# Patient Record
Sex: Male | Born: 1970 | Race: Black or African American | Hispanic: No | Marital: Single | Smoking: Current some day smoker
Health system: Southern US, Community
[De-identification: ages and names within clinical notes are randomized; demographics above are authoritative.]

## PROBLEM LIST (undated history)

## (undated) DIAGNOSIS — J45909 Unspecified asthma, uncomplicated: Secondary | ICD-10-CM

---

## 2013-10-20 ENCOUNTER — Emergency Department (HOSPITAL_COMMUNITY)
Admission: EM | Admit: 2013-10-20 | Discharge: 2013-10-20 | Disposition: A | Discharge status: Home / Self Care | Payer: Medicaid - Out of State | Attending: Emergency Medicine | Admitting: Emergency Medicine

## 2013-10-20 ENCOUNTER — Encounter (HOSPITAL_COMMUNITY): Payer: Self-pay | Admitting: Emergency Medicine

## 2013-10-20 ENCOUNTER — Emergency Department (HOSPITAL_COMMUNITY): Payer: Medicaid - Out of State

## 2013-10-20 DIAGNOSIS — IMO0002 Reserved for concepts with insufficient information to code with codable children: Secondary | ICD-10-CM | POA: Insufficient documentation

## 2013-10-20 DIAGNOSIS — S46912A Strain of unspecified muscle, fascia and tendon at shoulder and upper arm level, left arm, initial encounter: Secondary | ICD-10-CM

## 2013-10-20 DIAGNOSIS — Z79899 Other long term (current) drug therapy: Secondary | ICD-10-CM | POA: Insufficient documentation

## 2013-10-20 DIAGNOSIS — Y939 Activity, unspecified: Secondary | ICD-10-CM | POA: Insufficient documentation

## 2013-10-20 DIAGNOSIS — F17200 Nicotine dependence, unspecified, uncomplicated: Secondary | ICD-10-CM | POA: Insufficient documentation

## 2013-10-20 DIAGNOSIS — Y929 Unspecified place or not applicable: Secondary | ICD-10-CM | POA: Insufficient documentation

## 2013-10-20 DIAGNOSIS — X58XXXA Exposure to other specified factors, initial encounter: Secondary | ICD-10-CM | POA: Insufficient documentation

## 2013-10-20 DIAGNOSIS — J45909 Unspecified asthma, uncomplicated: Secondary | ICD-10-CM | POA: Insufficient documentation

## 2013-10-20 HISTORY — DX: Unspecified asthma, uncomplicated: J45.909

## 2013-10-20 NOTE — ED Notes (Signed)
Patient transported to X-ray 

## 2013-10-20 NOTE — ED Provider Notes (Signed)
Medical screening examination/treatment/procedure(s) were performed by non-physician practitioner and as supervising physician I was immediately available for consultation/collaboration.  EKG Interpretation    Date/Time:  Saturday October 20 2013 16:37:33 EST Ventricular Rate:  82 PR Interval:  120 QRS Duration: 82 QT Interval:  360 QTC Calculation: 420 R Axis:   61 Text Interpretation:  Normal sinus rhythm with sinus arrhythmia Normal ECG No old tracing to compare Confirmed by Blanca Carreon  MD, Genea Rheaume ([id number]) on 10/20/2013 4:59:09 PM              Lyanne CoKevin M Kissie Ziolkowski, MD 10/20/13 2302

## 2013-10-20 NOTE — Discharge Instructions (Signed)
Muscle Strain A muscle strain is an injury that occurs when a muscle is stretched beyond its normal length. Usually a small number of muscle fibers are torn when this happens. Muscle strain is rated in degrees. First-degree strains have the least amount of muscle fiber tearing and pain. Second-degree and third-degree strains have increasingly more tearing and pain.  Usually, recovery from muscle strain takes 1 2 weeks. Complete healing takes 5 6 weeks.  CAUSES  Muscle strain happens when a sudden, violent force placed on a muscle stretches it too far. This may occur with lifting, sports, or a fall.  RISK FACTORS Muscle strain is especially common in athletes.  SIGNS AND SYMPTOMS At the site of the muscle strain, there may be:  Pain.  Bruising.  Swelling.  Difficulty using the muscle due to pain or lack of normal function. DIAGNOSIS  Your health care provider will perform a physical exam and ask about your medical history. TREATMENT  Often, the best treatment for a muscle strain is resting, icing, and applying cold compresses to the injured area.  HOME CARE INSTRUCTIONS   Use the PRICE method of treatment to promote muscle healing during the first 2 3 days after your injury. The PRICE method involves:  Protecting the muscle from being injured again.  Restricting your activity and resting the injured body part.  Icing your injury. To do this, put ice in a plastic bag. Place a towel between your skin and the bag. Then, apply the ice and leave it on from 15 20 minutes each hour. After the third day, switch to moist heat packs.  Apply compression to the injured area with a splint or elastic bandage. Be careful not to wrap it too tightly. This may interfere with blood circulation or increase swelling.  Elevate the injured body part above the level of your heart as often as you can.  Only take over-the-counter or prescription medicines for pain, discomfort, or fever as directed by your  health care provider.  Warming up prior to exercise helps to prevent future muscle strains. SEEK MEDICAL CARE IF:   You have increasing pain or swelling in the injured area.  You have numbness, tingling, or a significant loss of strength in the injured area. MAKE SURE YOU:   Understand these instructions.  Will watch your condition.  Will get help right away if you are not doing well or get worse. Document Released: 09/06/2005 Document Revised: 06/27/2013 Document Reviewed: 04/05/2013 [facility] Patient Information 2014 [place], [place].  Shoulder Sprain A shoulder sprain is the result of damage to the tough, fiber-like tissues (ligaments) that help hold your shoulder in place. The ligaments may be stretched or torn. Besides the main shoulder joint (the ball and socket), there are several smaller joints that connect the bones in this area. A sprain usually involves one of those joints. Most often it is the acromioclavicular (or AC) joint. That is the joint that connects the collarbone (clavicle) and the shoulder blade (scapula) at the top point of the shoulder blade (acromion). A shoulder sprain is a mild form of what is called a shoulder separation. Recovering from a shoulder sprain may take some time. For some, pain lingers for several months. Most people recover without long term problems. CAUSES   A shoulder sprain is usually caused by some kind of trauma. This might be:  Falling on an outstretched arm.  Being hit hard on the shoulder.  Twisting the arm.  Shoulder sprains are more likely to occur  in people who:  Play sports.  Have balance or coordination problems. SYMPTOMS   Pain when you move your shoulder.  Limited ability to move the shoulder.  Swelling and tenderness on top of the shoulder.  Redness or warmth in the shoulder.  Bruising.  A change in the shape of the shoulder. DIAGNOSIS  Your healthcare provider may:  Ask about your symptoms.  Ask about  recent activity that might have caused those symptoms.  Examine your shoulder. You may be asked to do simple exercises to test movement. The other shoulder will be examined for comparison.  Order some tests that provide a look inside the body. They can show the extent of the injury. The tests could include:  X-rays.  CT (computed tomography) scan.  MRI (magnetic resonance imaging) scan. RISKS AND COMPLICATIONS  Loss of full shoulder motion.  Ongoing shoulder pain. TREATMENT  How long it takes to recover from a shoulder sprain depends on how severe it was. Treatment options may include:  Rest. You should not use the arm or shoulder until it heals.  Ice. For 2 or 3 days after the injury, put an ice pack on the shoulder up to 4 times a day. It should stay on for 15 to 20 minutes each time. Wrap the ice in a towel so it does not touch your skin.  Over-the-counter medicine to relieve pain.  A sling or brace. This will keep the arm still while the shoulder is healing.  Physical therapy or rehabilitation exercises. These will help you regain strength and motion. Ask your healthcare provider when it is OK to begin these exercises.  Surgery. The need for surgery is rare with a sprained shoulder, but some people may need surgery to keep the joint in place and reduce pain. HOME CARE INSTRUCTIONS   Ask your healthcare provider about what you should and should not do while your shoulder heals.  Make sure you know how to apply ice to the correct area of your shoulder.  Talk with your healthcare provider about which medications should be used for pain and swelling.  If rehabilitation therapy will be needed, ask your healthcare provider to refer you to a therapist. If it is not recommended, then ask about at-home exercises. Find out when exercise should begin. SEEK MEDICAL CARE IF:  Your pain, swelling, or redness at the joint increases. SEEK IMMEDIATE MEDICAL CARE IF:   You have a  fever.  You cannot move your arm or shoulder. Document Released: 01/23/2009 Document Revised: 11/29/2011 Document Reviewed: 01/23/2009 [facility] Patient Information 2014 [place], [place].

## 2013-10-20 NOTE — ED Provider Notes (Signed)
CSN: [id number]     Arrival date & time 10/20/13  1544 History   First MD Initiated Contact with Patient 10/20/13 1611     This chart was scribed for Ian CoKevin M Campos, MD by Arlan OrganAshley Leger, ED Scribe. This patient was seen in room TR05C/TR05C and the patient's care was started 4:18 PM.   Chief Complaint  Patient presents with  . Shoulder Pain    Left shoulder   The history is provided by the patient. No language interpreter was used.    HPI Comments: Ian Ewing is a 43 y.o. dominant right handed male who presents to the Emergency Department complaining of sudden onset, ongoing, constant, moderate left sided shoulder pain that radiates to his back and to the left side of his chest that initially started about 2 hours prior to arrival. Pt states he was cleaning out his car when he noted the onset of his pain. Denies any trauma or heavy lifting in the last several days. He states lifting his left arm worsens his discomfort, and denies any alleviating factors at this time. Pt denies any history of injury to his left shoulder.  He has not tried anything OTC for his shoulder pain. Denies any numbness or tingling to his left arm. Pt has a PMHx of asthma, and denies any other complaints this visit.  Past Medical History  Diagnosis Date  . Asthma    History reviewed. No pertinent past surgical history. No family history on file. History  Substance Use Topics  . Smoking status: Current Some Day Smoker  . Smokeless tobacco: Not on file  . Alcohol Use: No    Review of Systems  Musculoskeletal: Positive for arthralgias (Left sided shoulder pain).  Neurological: Negative for numbness.  All other systems reviewed and are negative.    Allergies  Review of patient's allergies indicates no known allergies.  Home Medications   Current Outpatient Rx  Name  Route  Sig  Dispense  Refill  . albuterol (PROVENTIL HFA;VENTOLIN HFA) 108 (90 BASE) MCG/ACT inhaler   Inhalation   Inhale 2 puffs into the  lungs every 6 (six) hours as needed for wheezing or shortness of breath.           Triage Vitals: BP 140/85  Pulse 98  Temp(Src) 97 F (36.1 C) (Oral)  Resp 18  SpO2 96%  Physical Exam  Nursing note and vitals reviewed. Constitutional: He is oriented to person, place, and time. He appears well-developed and well-nourished.  HENT:  Head: Normocephalic and atraumatic.  Eyes: EOM are normal.  Neck: Normal range of motion.  Cardiovascular: Normal rate.   Pulmonary/Chest: Effort normal.  Musculoskeletal: He exhibits tenderness.       Left shoulder: He exhibits decreased range of motion, tenderness and pain. He exhibits no bony tenderness, no swelling, no effusion, no crepitus, no deformity, normal pulse and normal strength.  Negative impingement signs, ROM limited due to pain but strength intact - sensation normal.  Neurological: He is alert and oriented to person, place, and time. He has normal strength. He displays no atrophy. No cranial nerve deficit or sensory deficit. He exhibits normal muscle tone. Coordination and gait normal. GCS eye subscore is 4. GCS verbal subscore is 5. GCS motor subscore is 6.  5/5 strength biceps, triceps, trapezius  Skin: Skin is warm and dry.  Psychiatric: He has a normal mood and affect. His behavior is normal.    ED Course  Procedures (including critical care time)  DIAGNOSTIC STUDIES: Oxygen  Saturation is 96% on RA, adequate by my interpretation.    COORDINATION OF CARE: 4:23 PM- Will order an X-Ray and EKG. Discussed treatment plan with pt at bedside and pt agreed to plan.     Labs Review Labs Reviewed - No data to display Imaging Review Dg Shoulder Left  10/20/2013   CLINICAL DATA:  Left shoulder pain  EXAM: LEFT SHOULDER - 2+ VIEW  COMPARISON:  None.  FINDINGS: There is no evidence of fracture or dislocation. There is no evidence of arthropathy or other focal bone abnormality. Soft tissues are unremarkable.  IMPRESSION: Negative.    Electronically Signed   By: Elige Ko   On: 10/20/2013 16:55    EKG Interpretation    Date/Time:  Saturday October 20 2013 16:37:33 EST Ventricular Rate:  82 PR Interval:  120 QRS Duration: 82 QT Interval:  360 QTC Calculation: 420 R Axis:   61 Text Interpretation:  Normal sinus rhythm with sinus arrhythmia Normal ECG No old tracing to compare Confirmed by CAMPOS  MD, KEVIN ([id number]) on 10/20/2013 4:59:09 PM          No results found for this or any previous visit. Dg Shoulder Left  10/20/2013   CLINICAL DATA:  Left shoulder pain  EXAM: LEFT SHOULDER - 2+ VIEW  COMPARISON:  None.  FINDINGS: There is no evidence of fracture or dislocation. There is no evidence of arthropathy or other focal bone abnormality. Soft tissues are unremarkable.  IMPRESSION: Negative.   Electronically Signed   By: Elige Ko   On: 10/20/2013 16:55      MDM  Left shoulder strain  Patient here with left shoulder pain that began while cleaning his car.  There is no evidence of acute injury, ROM limited due to pain, mild pain at insertion site of supraspinatous, no neck pain or clinical evidence of cord compression or cauda equina.  Patient placed in sling, he refuses medication at this time and will try over the counter muscle rubs and massage.  I personally performed the services described in this documentation, which was scribed in my presence. The recorded information has been reviewed and is accurate.    Izola Price Marisue Humble, PA-C 10/20/13 1713

## 2013-10-20 NOTE — ED Notes (Signed)
Pt c/o left shoulder pain onset 2 hours PTA. Pt reports that he was cleaning out his car prior to onset of pain.

## 2014-01-02 ENCOUNTER — Encounter (HOSPITAL_COMMUNITY): Payer: Self-pay | Admitting: Emergency Medicine

## 2014-01-02 ENCOUNTER — Emergency Department (HOSPITAL_COMMUNITY)
Admission: EM | Admit: 2014-01-02 | Discharge: 2014-01-02 | Disposition: A | Discharge status: Home / Self Care | Payer: Medicaid - Out of State | Attending: Emergency Medicine | Admitting: Emergency Medicine

## 2014-01-02 ENCOUNTER — Emergency Department (HOSPITAL_COMMUNITY): Payer: Medicaid - Out of State

## 2014-01-02 DIAGNOSIS — F411 Generalized anxiety disorder: Secondary | ICD-10-CM | POA: Insufficient documentation

## 2014-01-02 DIAGNOSIS — R51 Headache: Secondary | ICD-10-CM | POA: Insufficient documentation

## 2014-01-02 DIAGNOSIS — R079 Chest pain, unspecified: Secondary | ICD-10-CM

## 2014-01-02 DIAGNOSIS — Z79899 Other long term (current) drug therapy: Secondary | ICD-10-CM | POA: Insufficient documentation

## 2014-01-02 DIAGNOSIS — J45901 Unspecified asthma with (acute) exacerbation: Secondary | ICD-10-CM | POA: Insufficient documentation

## 2014-01-02 DIAGNOSIS — F17200 Nicotine dependence, unspecified, uncomplicated: Secondary | ICD-10-CM | POA: Insufficient documentation

## 2014-01-02 LAB — I-STAT TROPONIN, ED
TROPONIN I, POC: 0.01 ng/mL (ref 0.00–0.08)
Troponin i, poc: 0 ng/mL (ref 0.00–0.08)

## 2014-01-02 LAB — COMPREHENSIVE METABOLIC PANEL
ALT: 46 U/L (ref 0–53)
AST: 49 U/L — ABNORMAL HIGH (ref 0–37)
Albumin: 4.1 g/dL (ref 3.5–5.2)
Alkaline Phosphatase: 52 U/L (ref 39–117)
BUN: 12 mg/dL (ref 6–23)
CALCIUM: 9.5 mg/dL (ref 8.4–10.5)
CO2: 26 mEq/L (ref 19–32)
Chloride: 98 mEq/L (ref 96–112)
Creatinine, Ser: 1.45 mg/dL — ABNORMAL HIGH (ref 0.50–1.35)
GFR calc non Af Amer: 59 mL/min — ABNORMAL LOW (ref >90)
GFR, EST AFRICAN AMERICAN: 68 mL/min — AB (ref >90)
GLUCOSE: 85 mg/dL (ref 70–99)
Potassium: 4.6 mEq/L (ref 3.7–5.3)
Sodium: 139 mEq/L (ref 137–147)
TOTAL PROTEIN: 7.5 g/dL (ref 6.0–8.3)
Total Bilirubin: <0.2 mg/dL — ABNORMAL LOW (ref 0.3–1.2)

## 2014-01-02 LAB — CBC
HEMATOCRIT: 42.2 % (ref 39.0–52.0)
Hemoglobin: 14.7 g/dL (ref 13.0–17.0)
MCH: 30.4 pg (ref 26.0–34.0)
MCHC: 34.8 g/dL (ref 30.0–36.0)
MCV: 87.4 fL (ref 78.0–100.0)
PLATELETS: 213 10*3/uL (ref 150–400)
RBC: 4.83 MIL/uL (ref 4.22–5.81)
RDW: 12.8 % (ref 11.5–15.5)
WBC: 7.3 10*3/uL (ref 4.0–10.5)

## 2014-01-02 NOTE — ED Notes (Signed)
PA at bedside.

## 2014-01-02 NOTE — Discharge Instructions (Signed)
Chest Pain (Nonspecific) °It is often hard to give a specific diagnosis for the cause of chest pain. There is always a chance that your pain could be related to something serious, such as a heart attack or a blood clot in the lungs. You need to follow up with your caregiver for further evaluation. °CAUSES  °· Heartburn. °· Pneumonia or bronchitis. °· Anxiety or stress. °· Inflammation around your heart (pericarditis) or lung (pleuritis or pleurisy). °· A blood clot in the lung. °· A collapsed lung (pneumothorax). It can develop suddenly on its own (spontaneous pneumothorax) or from injury (trauma) to the chest. °· Shingles infection (herpes zoster virus). °The chest wall is composed of bones, muscles, and cartilage. Any of these can be the source of the pain. °· The bones can be bruised by injury. °· The muscles or cartilage can be strained by coughing or overwork. °· The cartilage can be affected by inflammation and become sore (costochondritis). °DIAGNOSIS  °Lab tests or other studies, such as X-rays, electrocardiography, stress testing, or cardiac imaging, may be needed to find the cause of your pain.  °TREATMENT  °· Treatment depends on what may be causing your chest pain. Treatment may include: °· Acid blockers for heartburn. °· Anti-inflammatory medicine. °· Pain medicine for inflammatory conditions. °· Antibiotics if an infection is present. °· You may be advised to change lifestyle habits. This includes stopping smoking and avoiding alcohol, caffeine, and chocolate. °· You may be advised to keep your head raised (elevated) when sleeping. This reduces the chance of acid going backward from your stomach into your esophagus. °· Most of the time, nonspecific chest pain will improve within 2 to 3 days with rest and mild pain medicine. °HOME CARE INSTRUCTIONS  °· If antibiotics were prescribed, take your antibiotics as directed. Finish them even if you start to feel better. °· For the next few days, avoid physical  activities that bring on chest pain. Continue physical activities as directed. °· Do not smoke. °· Avoid drinking alcohol. °· Only take over-the-counter or prescription medicine for pain, discomfort, or fever as directed by your caregiver. °· Follow your caregiver's suggestions for further testing if your chest pain does not go away. °· Keep any follow-up appointments you made. If you do not go to an appointment, you could develop lasting (chronic) problems with pain. If there is any problem keeping an appointment, you must call to reschedule. °SEEK MEDICAL CARE IF:  °· You think you are having problems from the medicine you are taking. Read your medicine instructions carefully. °· Your chest pain does not go away, even after treatment. °· You develop a rash with blisters on your chest. °SEEK IMMEDIATE MEDICAL CARE IF:  °· You have increased chest pain or pain that spreads to your arm, neck, jaw, back, or abdomen. °· You develop shortness of breath, an increasing cough, or you are coughing up blood. °· You have severe back or abdominal pain, feel nauseous, or vomit. °· You develop severe weakness, fainting, or chills. °· You have a fever. °THIS IS AN EMERGENCY. Do not wait to see if the pain will go away. Get medical help at once. Call your local emergency services (911 in [place] Do not drive yourself to the hospital. °MAKE SURE YOU:  °· Understand these instructions. °· Will watch your condition. °· Will get help right away if you are not doing well or get worse. °Document Released: 06/16/2005 Document Revised: 11/29/2011 Document Reviewed: 04/11/2008 °Exit[employer]® Patient Information ©2014 [employer],   LLC. ° °

## 2014-01-02 NOTE — ED Notes (Signed)
Pt reports onset of left sided chest pain since this am. Pain 7/10. Reports pain as tightness. Reports shortness of breath, dizziness.

## 2014-01-02 NOTE — ED Provider Notes (Signed)
CSN: [id number]     Arrival date & time 01/02/14  1817 History   First MD Initiated Contact with Patient 01/02/14 1859     Chief Complaint  Patient presents with  . Chest Pain     (Consider location/radiation/quality/duration/timing/severity/associated sxs/prior Treatment) HPI  This is a 43 y.o. male with PMH asthma, presenting today with CP.  Onset at 0700 this AM.  Located left chest.  Intermittent.  Sharp, squeezing.  No meds taken.  Worsened with walking around.  Radiates down left arm.  Positive for anxiety, dyspnea, lightheadedness.  Negative for new diaphoresis, abdominal pain, nausea, vomiting, trauma to chest, cough.  Past Medical History  Diagnosis Date  . Asthma    History reviewed. No pertinent past surgical history. No family history on file. History  Substance Use Topics  . Smoking status: Current Some Day Smoker  . Smokeless tobacco: Not on file  . Alcohol Use: No    Review of Systems  Constitutional: Negative for fever and chills.  HENT: Negative for facial swelling.   Eyes: Negative for pain and visual disturbance.  Respiratory: Positive for shortness of breath.   Cardiovascular: Positive for chest pain.  Gastrointestinal: Negative for nausea and vomiting.  Genitourinary: Negative for dysuria.  Musculoskeletal: Negative for arthralgias and myalgias.  Neurological: Negative for headaches.  Psychiatric/Behavioral: Negative for behavioral problems.      Allergies  Review of patient's allergies indicates no known allergies.  Home Medications   Prior to Admission medications   Medication Sig Start Date End Date Taking? Authorizing Provider  albuterol (PROVENTIL HFA;VENTOLIN HFA) 108 (90 BASE) MCG/ACT inhaler Inhale 2 puffs into the lungs every 6 (six) hours as needed for wheezing or shortness of breath.   Yes Historical Provider, MD   BP 133/90  Pulse 108  Temp(Src) 98.2 F (36.8 C) (Oral)  Resp 16  Ht 5\' 9"  (1.753 m)  Wt 250 lb (113.399 kg)  BMI  36.90 kg/m2  SpO2 96% Physical Exam  Constitutional: He is oriented to person, place, and time. He appears well-developed and well-nourished. No distress.  HENT:  Head: Normocephalic and atraumatic.  Mouth/Throat: No oropharyngeal exudate.  Eyes: Conjunctivae are normal. Pupils are equal, round, and reactive to light. No scleral icterus.  Neck: Normal range of motion. No tracheal deviation present. No thyromegaly present.  Cardiovascular: Normal rate, regular rhythm and normal heart sounds.  Exam reveals no gallop and no friction rub.   No murmur heard. Pulmonary/Chest: Effort normal and breath sounds normal. No stridor. No respiratory distress. He has no wheezes. He has no rales. He exhibits no tenderness.  Abdominal: Soft. He exhibits no distension and no mass. There is no tenderness. There is no rebound and no guarding.  Musculoskeletal: Normal range of motion. He exhibits no edema.  Neurological: He is alert and oriented to person, place, and time.  Skin: Skin is warm and dry. He is not diaphoretic.    ED Course  Procedures (including critical care time) Labs Review Labs Reviewed  CBC  COMPREHENSIVE METABOLIC PANEL  I-STAT TROPOININ, ED    Imaging Review No results found.   EKG Interpretation None      MDM   Final diagnoses:  None    This is a 43 y.o. male with PMH asthma, presenting today with CP.  Onset at 0700 this AM.  Located left chest.  Intermittent.  Sharp, squeezing.  No meds taken.  Worsened with walking around.  Radiates down left arm.  Positive for anxiety, dyspnea, lightheadedness.  Negative for new diaphoresis, abdominal pain, nausea, vomiting, trauma to chest, cough.  Exam as above.  Vitals normal (when pt calms down, HR rests at around 89 BPM).  Examination is WNL.  DDx at this time includes ACS, PE, PTX, PNA, pericarditis, tamponade, dissection, esophageal pathology.     Date: 01/02/2014  Rate: 114  Rhythm: normal sinus rhythm  QRS Axis: normal   Intervals: normal  ST/T Wave abnormalities: normal  Conduction Disutrbances:none  Narrative Interpretation:   Old EKG Reviewed: T waves are slightly flattened in leads V4-V6 without actually flipping.  There are T wave inversions in lead III  HEART score of 3.  Troponin X 2 are within normal limits. I doubt this represents ACS, PE, PTX, PNA, pericarditis, tamponade, dissection, esophageal pathology at this time.  I do believe, however, that the patient needs to followup with his primary physician in the next 48 hours for reevaluation and possible stress test. I have discussed this at great length with the patient and he expresses understanding. Patient has been pain-free during his entire evaluation in the emergency department. Pt stable for discharge, FU.  All questions answered.  Return precautions given.  I have discussed case and care has been guided by my attending physician, Dr. Juleen ChinaKohut.      Loma BostonStirling Chevi Lim, MD 01/03/14 0111

## 2014-01-03 NOTE — ED Provider Notes (Signed)
I saw and evaluated the patient, reviewed the resident's note and I agree with the findings and plan.   EKG Interpretation   Date/Time:  Wednesday January 02 2014 18:21:29 EDT Ventricular Rate:  114 PR Interval:  120 QRS Duration: 80 QT Interval:  324 QTC Calculation: 446 R Axis:   50 Text Interpretation:  Sinus tachycardia Non-specific ST-t changes  Confirmed by Juleen ChinaKOHUT  MD, Remell Giaimo ([id number]) on 01/02/2014 8:41:2246 PM      43 year old male with chest pain. He has a HEART score of 3. 2 points given for his story and 1 for nonspecific ST changes. My suspicion for ACS is low enough that I feel is appropriate for discharge at this time. He needs to discuss with his PCP for further evaluation and possible stress testing. Doubt infectious, pulmonary embolism, per carditis or dissection. Return precautions were discussed. Outpatient followup.  Raeford RazorStephen Stamatia Masri, MD 01/03/14 [phone]

## 2014-02-03 ENCOUNTER — Encounter (HOSPITAL_COMMUNITY): Payer: Self-pay | Admitting: Emergency Medicine

## 2014-02-03 ENCOUNTER — Emergency Department (HOSPITAL_COMMUNITY)
Admission: EM | Admit: 2014-02-03 | Discharge: 2014-02-03 | Disposition: A | Discharge status: Home / Self Care | Payer: Medicaid - Out of State | Attending: Emergency Medicine | Admitting: Emergency Medicine

## 2014-02-03 DIAGNOSIS — M26629 Arthralgia of temporomandibular joint, unspecified side: Secondary | ICD-10-CM

## 2014-02-03 DIAGNOSIS — M26609 Unspecified temporomandibular joint disorder, unspecified side: Secondary | ICD-10-CM | POA: Insufficient documentation

## 2014-02-03 DIAGNOSIS — J45909 Unspecified asthma, uncomplicated: Secondary | ICD-10-CM | POA: Insufficient documentation

## 2014-02-03 DIAGNOSIS — F17200 Nicotine dependence, unspecified, uncomplicated: Secondary | ICD-10-CM | POA: Insufficient documentation

## 2014-02-03 MED ORDER — NAPROXEN 500 MG PO TABS: 500.0000 mg | ORAL_TABLET | Freq: Two times a day (BID) | ORAL | Status: AC

## 2014-02-03 NOTE — ED Provider Notes (Signed)
CSN: [id number]     Arrival date & time 02/03/14  [id number] History   This chart was scribed for non-physician practitioner, Santiago GladHeather Genoveva Singleton , working with Shelda JakesScott W. Zackowski, MD, by Tana ConchStephen Methvin ED Scribe. This patient was seen in TR09C/TR09C and the patient's care was started at 9:50 AM.    Chief Complaint  Patient presents with  . Jaw Pain      The history is provided by the patient. No language interpreter was used.     HPI Comments: Ian Ewing is a 43 y.o. male who presents to the Emergency Department complaining of intermittent, worsening right jaw pain that has been since Tuesday and it worsened last night. Pain worse with opening and closing his mouth.   He denies any injury and any clicking in his jaw. Pt has not taken anything for the pain.  No difficulty swallowing.  No dental pain.  No ear pain.  No facial swelling.  No fever or chills.    Past Medical History  Diagnosis Date  . Asthma    No past surgical history on file. No family history on file. History  Substance Use Topics  . Smoking status: Current Some Day Smoker  . Smokeless tobacco: Not on file  . Alcohol Use: No    Review of Systems  Constitutional: Negative for fever and chills.  HENT: Negative for drooling and ear pain.        Right jaw pain  All other systems reviewed and are negative.     Allergies  Review of patient's allergies indicates no known allergies.  Home Medications   Prior to Admission medications   Medication Sig Start Date End Date Taking? Authorizing Provider  albuterol (PROVENTIL HFA;VENTOLIN HFA) 108 (90 BASE) MCG/ACT inhaler Inhale 2 puffs into the lungs every 6 (six) hours as needed for wheezing or shortness of breath.    Historical Provider, MD   BP 142/87  Pulse 87  Temp(Src) 98.1 F (36.7 C) (Oral)  Resp 20  Wt 253 lb 2 oz (114.817 kg)  SpO2 98% Physical Exam  Nursing note and vitals reviewed. Constitutional: He is oriented to person, place, and time. He appears  well-developed and well-nourished.  HENT:  Head: Normocephalic.  Right Ear: Hearing, tympanic membrane, external ear and ear canal normal.  Left Ear: Hearing, tympanic membrane, external ear and ear canal normal.  Mouth/Throat: Oropharynx is clear and moist. No trismus in the jaw. No dental abscesses.  Tenderness to a palpation on TMJ. Pain with opening mouth.  Teeth are intact.  Eyes: EOM are normal.  Neck: Normal range of motion.  Cardiovascular: Normal rate, regular rhythm and normal heart sounds.   Pulmonary/Chest: Effort normal and breath sounds normal.  Abdominal: He exhibits no distension.  Musculoskeletal: Normal range of motion.  Neurological: He is alert and oriented to person, place, and time.  Skin: Skin is warm and dry. No erythema.  Psychiatric: He has a normal mood and affect. His behavior is normal.    ED Course  Procedures (including critical care time) DIAGNOSTIC STUDIES: Oxygen Saturation is 98% on RA, normal by my interpretation.    COORDINATION OF CARE:   9:56 AM-Discussed treatment plan which includes use of anti-inflammatory and seeing a dentist for a mouth guard with pt at bedside and pt agreed to plan.   Labs Review Labs Reviewed - No data to display  Imaging Review No results found.   EKG Interpretation None      MDM   Final diagnoses:  None   Patient presenting with pain of the right TMJ.  No injury.  No trismus.  No signs of infection.  Patient instructed to take NSAIDs and follow up with dentist.  Return precautions given.  I personally performed the services described in this documentation, which was scribed in my presence. The recorded information has been reviewed and is accurate.    Santiago GladHeather Saxton Chain, PA-C 02/03/14 1046

## 2014-02-03 NOTE — ED Notes (Addendum)
PT talking on cell phone during D/C instructions. PT declined to stopping cell phone conversation during D/C process .

## 2014-02-03 NOTE — ED Notes (Signed)
PT reports intermitten jaw pain . Pain started on Thursday.

## 2014-02-05 NOTE — ED Provider Notes (Signed)
Medical screening examination/treatment/procedure(s) were performed by non-physician practitioner and as supervising physician I was immediately available for consultation/collaboration.   EKG Interpretation None        Shelda JakesScott W. Abdulmalik Darco, MD 02/05/14 [phone]

## 2015-02-22 IMAGING — CR DG SHOULDER 2+V*L*
3 series · 3 of 3 positions shown · non-contrast
Comparison: None.

CLINICAL DATA: Left shoulder pain

EXAM:
LEFT SHOULDER - 2+ VIEW

[w shoulder ap external left]
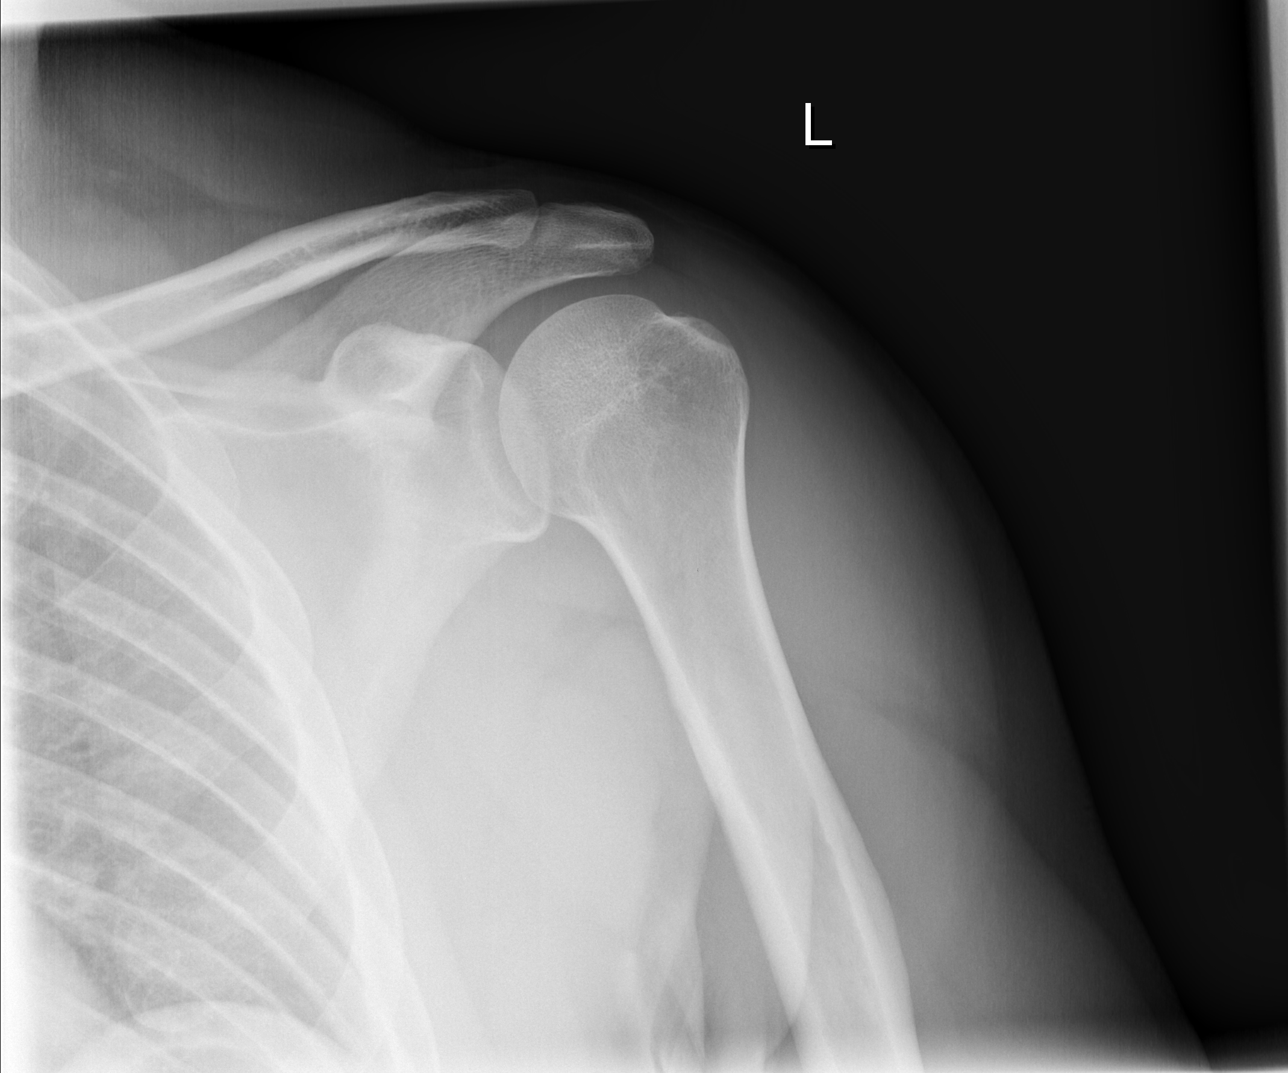

[w shoulder y view left]
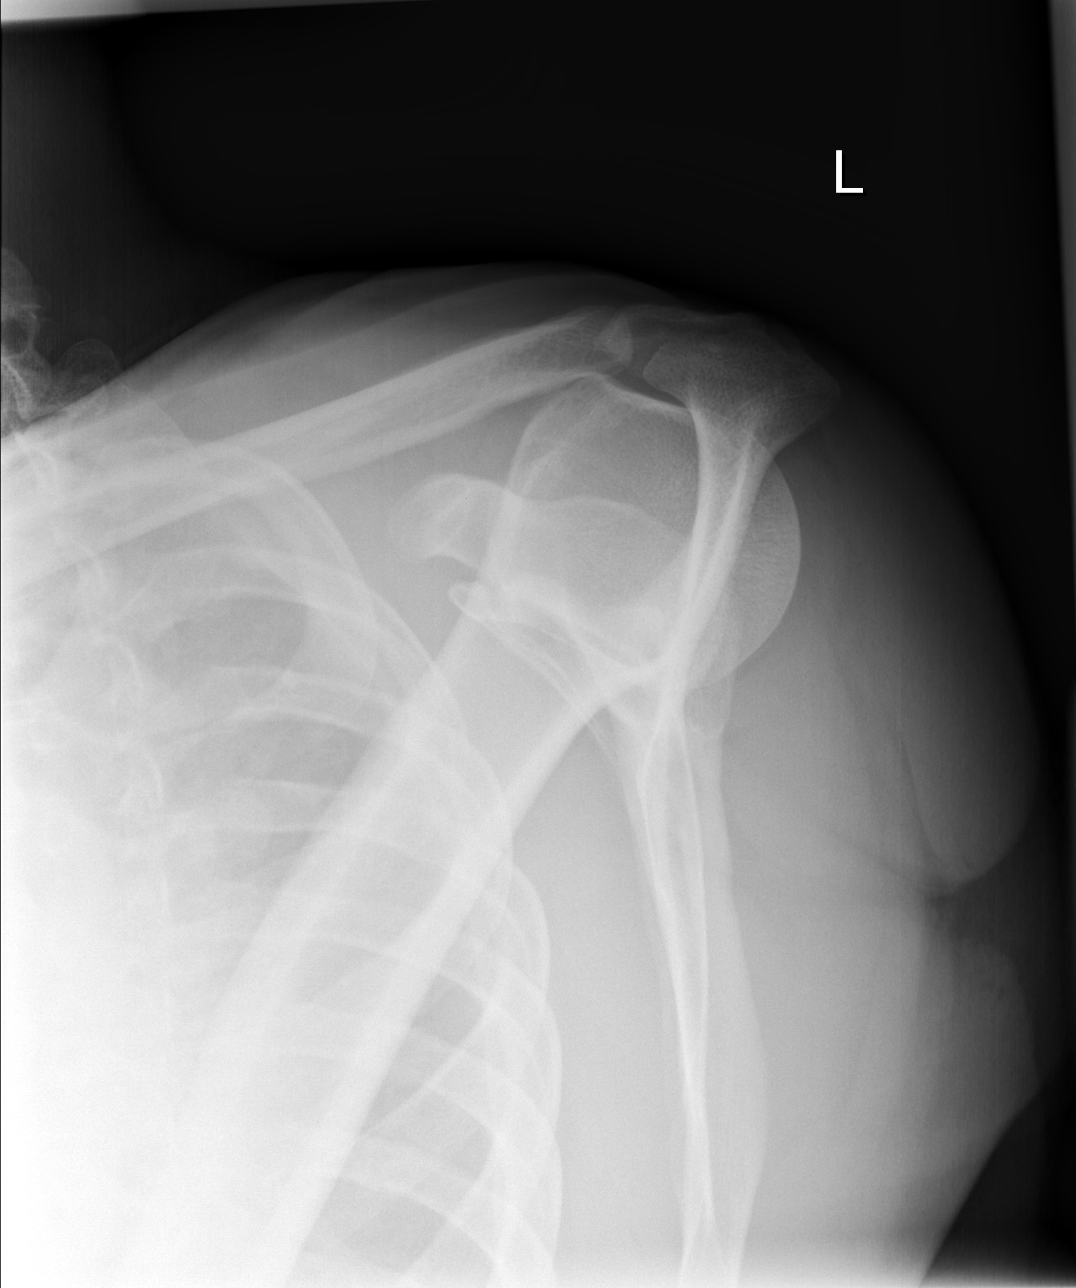

[x shoulder axillary left]
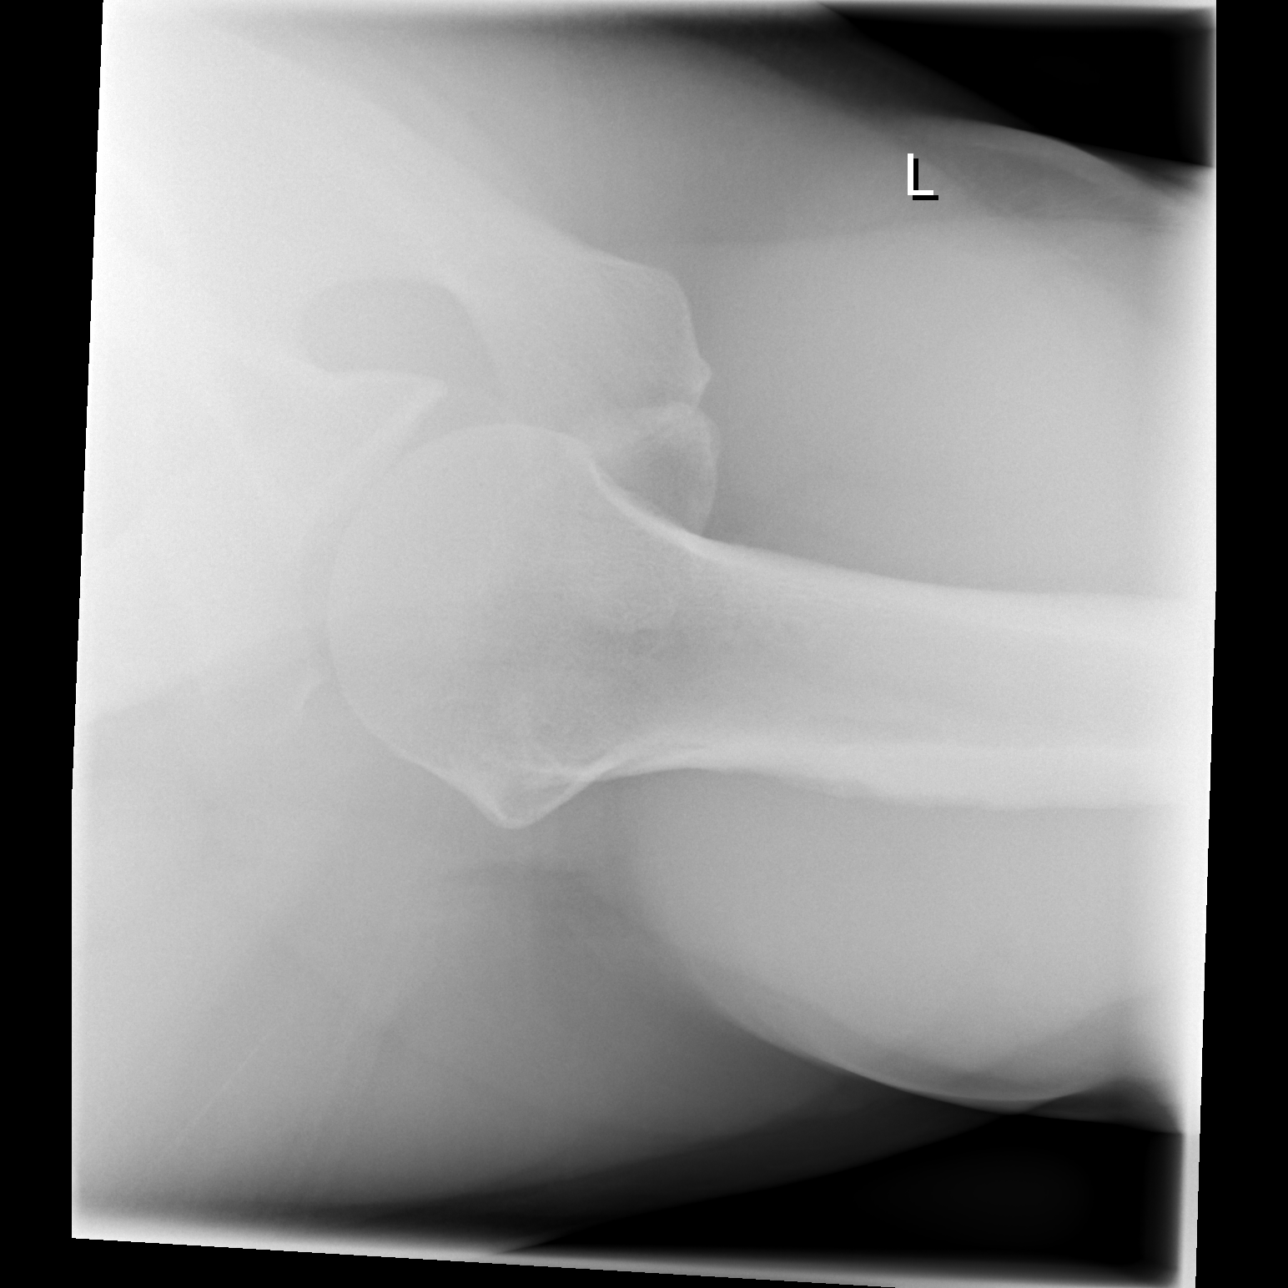

[3 of 3 positions shown; findings below may reference images not displayed]

FINDINGS: There is no evidence of fracture or dislocation. There is no
evidence of arthropathy or other focal bone abnormality. Soft
tissues are unremarkable.
IMPRESSION: Negative.

## 2015-05-07 IMAGING — CR DG CHEST 2V
2 series · 2 of 2 positions shown · non-contrast
Comparison: None.

CLINICAL DATA: Left-sided chest pain. Dizziness. Weakness. Pain
extending to the left arm.

EXAM:
CHEST  2 VIEW

[w chest pa]
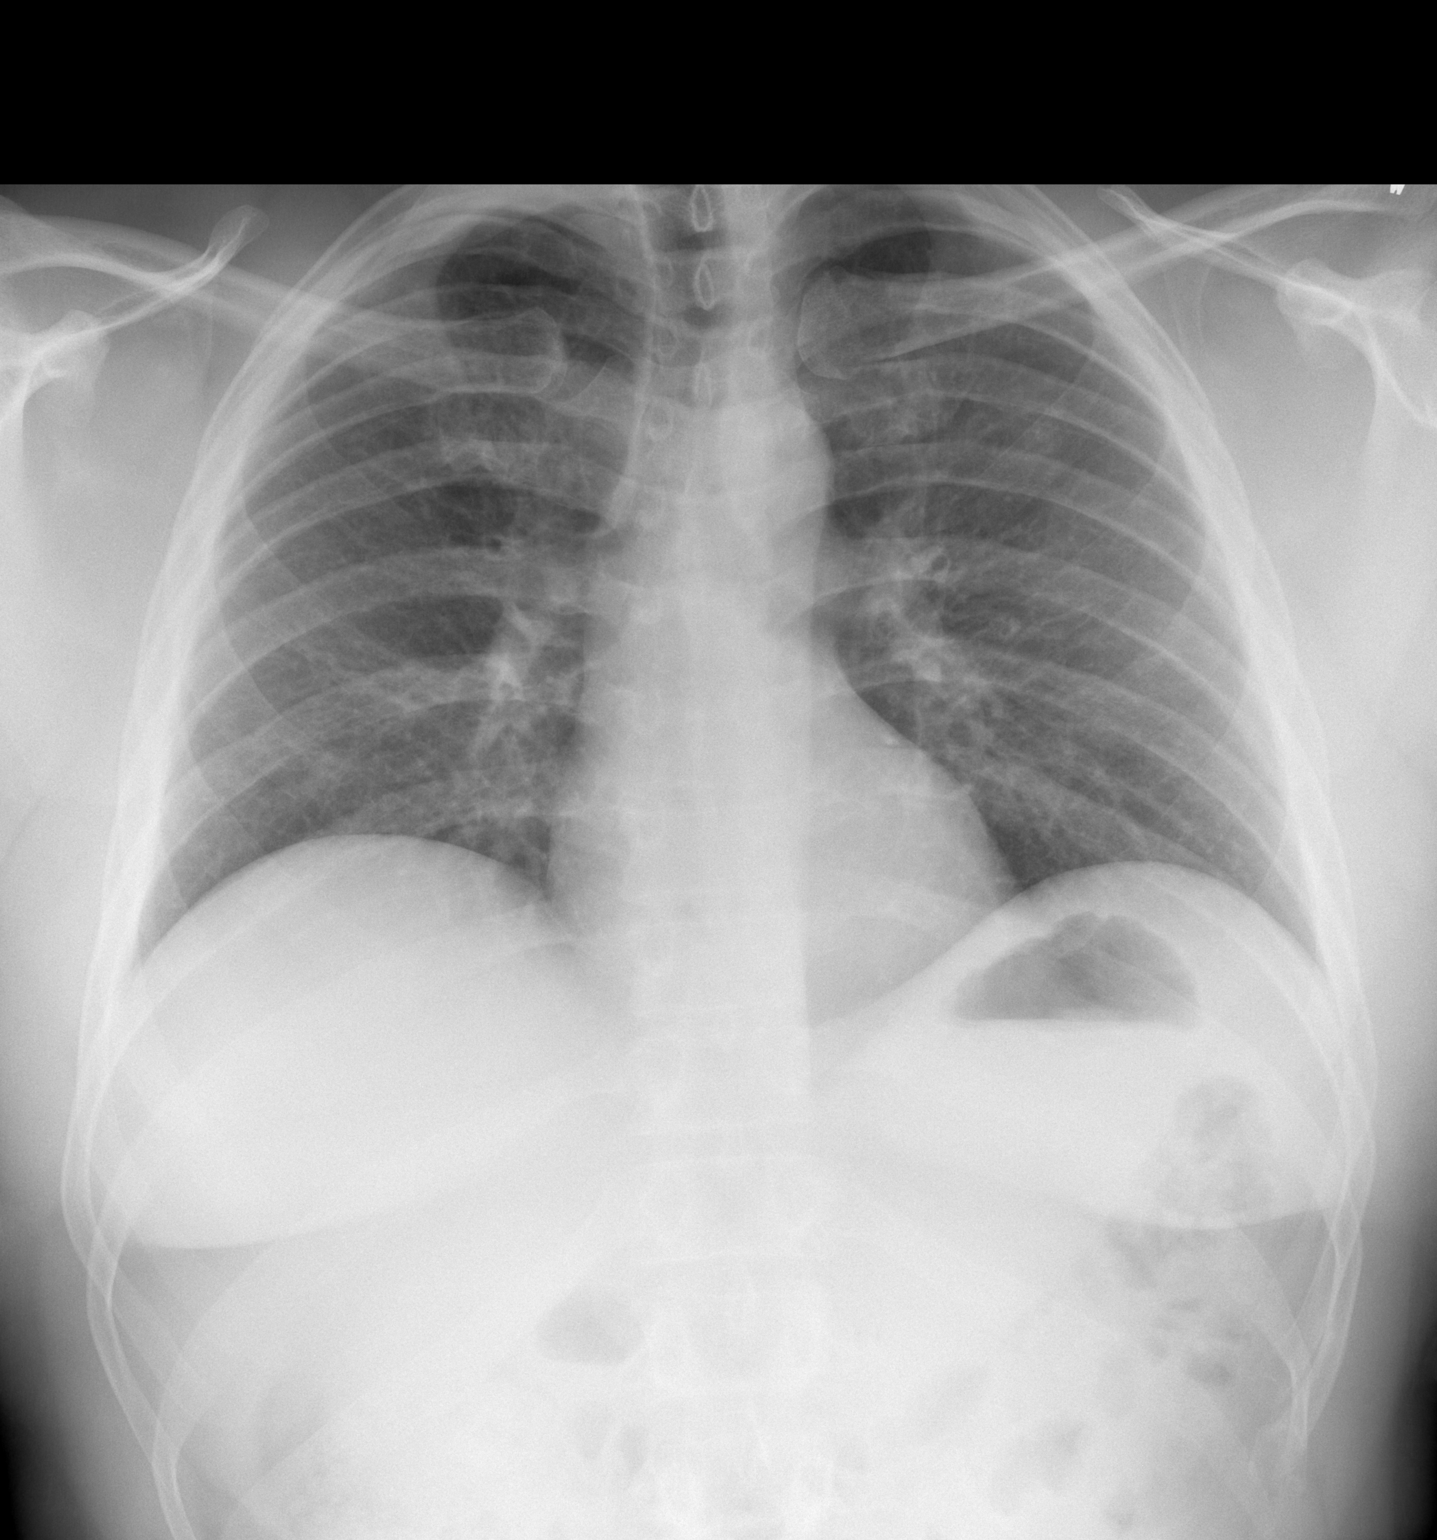

[w chest lat *]
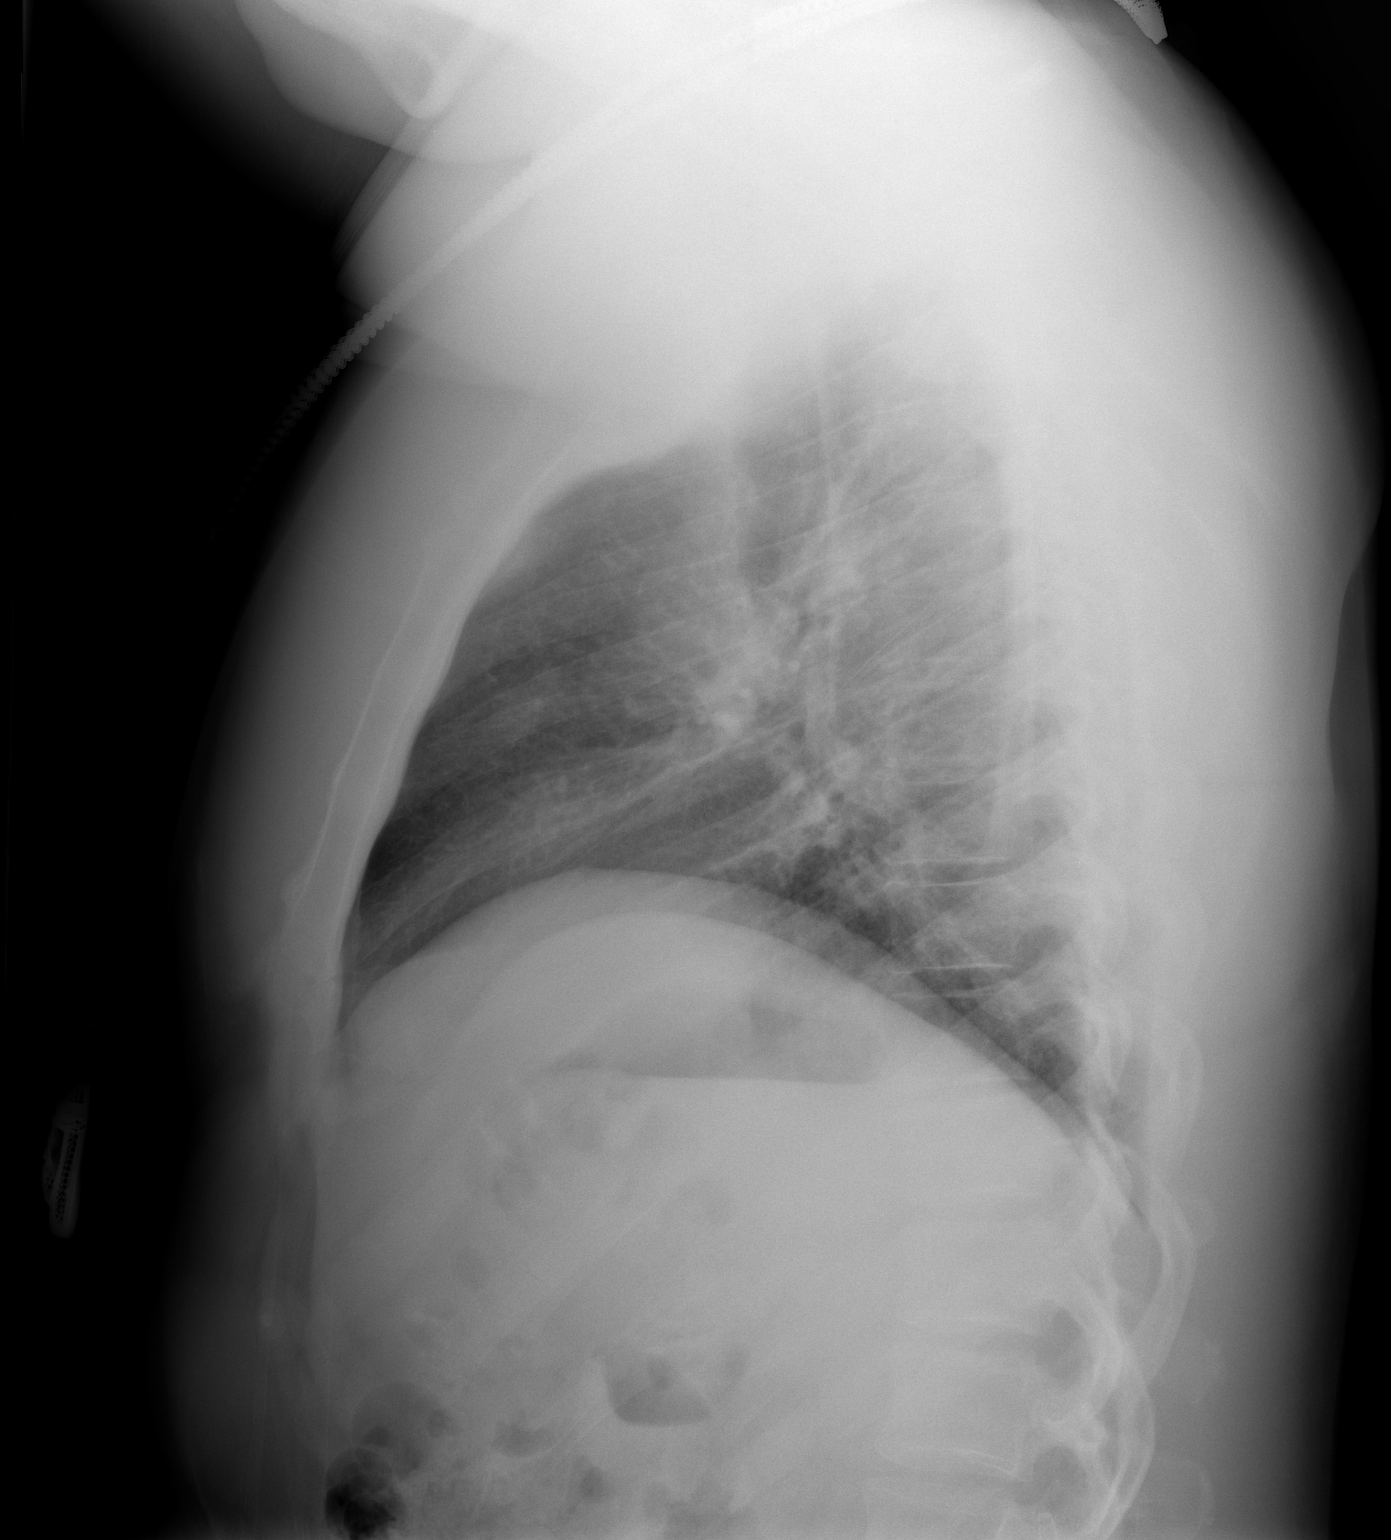

[2 of 2 positions shown; findings below may reference images not displayed]

FINDINGS: The heart size and mediastinal contours are within normal limits.
Both lungs are clear. The visualized skeletal structures are
unremarkable.
IMPRESSION: Normal chest
